# Patient Record
Sex: Female | Born: 1990 | Race: Black or African American | Hispanic: No | Marital: Single | Smoking: Never smoker
Health system: Southern US, Community
[De-identification: ages and names within clinical notes are randomized; demographics above are authoritative.]

## PROBLEM LIST (undated history)

## (undated) ENCOUNTER — Inpatient Hospital Stay (HOSPITAL_COMMUNITY): Payer: Self-pay

## (undated) DIAGNOSIS — J45909 Unspecified asthma, uncomplicated: Secondary | ICD-10-CM

---

## 2013-08-01 ENCOUNTER — Encounter (HOSPITAL_COMMUNITY): Payer: Self-pay | Admitting: Emergency Medicine

## 2013-08-01 ENCOUNTER — Emergency Department (HOSPITAL_COMMUNITY)
Admission: EM | Admit: 2013-08-01 | Discharge: 2013-08-01 | Discharge status: Against Medical Advice | Payer: TRICARE For Life (TFL) | Attending: Emergency Medicine | Admitting: Emergency Medicine

## 2013-08-01 DIAGNOSIS — J3489 Other specified disorders of nose and nasal sinuses: Secondary | ICD-10-CM | POA: Insufficient documentation

## 2013-08-01 DIAGNOSIS — M549 Dorsalgia, unspecified: Secondary | ICD-10-CM | POA: Insufficient documentation

## 2013-08-01 DIAGNOSIS — J45901 Unspecified asthma with (acute) exacerbation: Secondary | ICD-10-CM | POA: Insufficient documentation

## 2013-08-01 HISTORY — DX: Unspecified asthma, uncomplicated: J45.909

## 2013-08-01 MED ORDER — ALBUTEROL SULFATE (5 MG/ML) 0.5% IN NEBU
5.0000 mg | INHALATION_SOLUTION | Freq: Once | RESPIRATORY_TRACT | Status: AC
  Administered 2013-08-01: 5 mg via RESPIRATORY_TRACT
  Filled 2013-08-01: qty 1

## 2013-08-01 NOTE — ED Notes (Signed)
Pt. reports persistent productive cough with nasal congestion for several days unrelieved by MDI , denies fever or chills, pt. stated history of asthma.

## 2013-08-01 NOTE — ED Provider Notes (Signed)
CSN: [id number]     Arrival date & time 08/01/13  2053 History  This chart was scribed for non-physician practitioner Felicie Morn, NP working with Doug Sou, MD by Caryn Bee, ED Scribe and Greggory Stallion, ED scribe. This patient was seen in room TR05C/TR05C and the patient's care was started at 9:17 PM.    Chief Complaint  Patient presents with  . Cough  . Nasal Congestion    The history is provided by the patient. No language interpreter was used.   HPI Comments: Kim Banks is a 22 y.o. female who presents to the Emergency Department complaining of having a few asthma attacks today. She says her wheezing was worse this morning. Pt states she has had a cold, nasal congestion, and fever. She has tried her albuterol inhaler with no relief. She also has associated back pain. Pt denies nausea, fever, emesis, and sore throat.   Past Medical History  Diagnosis Date  . Asthma    History reviewed. No pertinent past surgical history. No family history on file. History  Substance Use Topics  . Smoking status: Never Smoker   . Smokeless tobacco: Not on file  . Alcohol Use: Yes   OB History   Grav Para Term Preterm Abortions TAB SAB Ect Mult Living                 Review of Systems  HENT: Positive for congestion.   Gastrointestinal: Negative for nausea and vomiting.  Musculoskeletal: Positive for back pain.  All other systems reviewed and are negative.    Allergies  Review of patient's allergies indicates no known allergies.  Home Medications  No current outpatient prescriptions on file. BP 130/72  Pulse 97  Temp(Src) 98.3 F (36.8 C) (Oral)  Resp 14  SpO2 98%  LMP 07/20/2013 Physical Exam  Nursing note and vitals reviewed. Constitutional: She is oriented to person, place, and time. She appears well-developed and well-nourished. No distress.  HENT:  Head: Normocephalic and atraumatic.  Eyes: EOM are normal.  Neck: Neck supple. No tracheal deviation present.   Cardiovascular: Normal rate, regular rhythm and normal heart sounds.   Pulmonary/Chest: Effort normal. No respiratory distress. She has wheezes.  Musculoskeletal: Normal range of motion.  Neurological: She is alert and oriented to person, place, and time.  Skin: Skin is warm and dry.  Psychiatric: She has a normal mood and affect. Her behavior is normal.    ED Course  Procedures (including critical care time) DIAGNOSTIC STUDIES: Oxygen Saturation is 98% on room air, normal by my interpretation.    COORDINATION OF CARE: 9:41 PM-Discussed treatment plan which includes nebulizer and prednisone with pt at bedside and pt agreed to plan.     Labs Review Labs Reviewed - No data to display Imaging Review No results found.  Patient apparently eloped after receiving her breathing treatment.    MDM  Asthma exacerbation.   I personally performed the services described in this documentation, which was scribed in my presence. The recorded information has been reviewed and is accurate.    Jimmye Norman, NP 08/01/13 [phone]

## 2013-08-01 NOTE — ED Notes (Signed)
Pt not in room.  Believe she has eloped.

## 2013-08-01 NOTE — ED Notes (Signed)
Pt not in room.

## 2013-08-02 NOTE — ED Provider Notes (Signed)
Medical screening examination/treatment/procedure(s) were performed by non-physician practitioner and as supervising physician I was immediately available for consultation/collaboration.  Jeanann Balinski, MD 08/02/13 0026 

## 2013-10-07 ENCOUNTER — Encounter (HOSPITAL_COMMUNITY): Payer: Self-pay | Admitting: Emergency Medicine

## 2013-10-07 ENCOUNTER — Emergency Department (HOSPITAL_COMMUNITY)
Admission: EM | Admit: 2013-10-07 | Discharge: 2013-10-07 | Disposition: A | Discharge status: Home / Self Care | Payer: TRICARE For Life (TFL) | Attending: Emergency Medicine | Admitting: Emergency Medicine

## 2013-10-07 DIAGNOSIS — M545 Low back pain, unspecified: Secondary | ICD-10-CM | POA: Insufficient documentation

## 2013-10-07 DIAGNOSIS — J45909 Unspecified asthma, uncomplicated: Secondary | ICD-10-CM | POA: Insufficient documentation

## 2013-10-07 DIAGNOSIS — Z79899 Other long term (current) drug therapy: Secondary | ICD-10-CM | POA: Insufficient documentation

## 2013-10-07 MED ORDER — DIAZEPAM 5 MG PO TABS: 5.0000 mg | ORAL_TABLET | Freq: Two times a day (BID) | ORAL | Status: DC

## 2013-10-07 MED ORDER — IBUPROFEN 800 MG PO TABS: 800.0000 mg | ORAL_TABLET | Freq: Three times a day (TID) | ORAL | Status: DC
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
## 2013-10-07 NOTE — ED Provider Notes (Signed)
Medical screening examination/treatment/procedure(s) were performed by non-physician practitioner and as supervising physician I was immediately available for consultation/collaboration.  EKG Interpretation   None        Doug Sou, MD 10/07/13 1614
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
## 2013-11-26 ENCOUNTER — Inpatient Hospital Stay (HOSPITAL_COMMUNITY)
Admission: AD | Admit: 2013-11-26 | Discharge: 2013-11-26 | Disposition: A | Discharge status: Home / Self Care | Source: Ambulatory Visit | Attending: Obstetrics & Gynecology | Admitting: Obstetrics & Gynecology

## 2013-11-26 ENCOUNTER — Encounter (HOSPITAL_COMMUNITY): Payer: Self-pay

## 2013-11-26 ENCOUNTER — Inpatient Hospital Stay (HOSPITAL_COMMUNITY)

## 2013-11-26 DIAGNOSIS — O99891 Other specified diseases and conditions complicating pregnancy: Secondary | ICD-10-CM | POA: Insufficient documentation

## 2013-11-26 DIAGNOSIS — M545 Low back pain, unspecified: Secondary | ICD-10-CM | POA: Insufficient documentation

## 2013-11-26 DIAGNOSIS — R109 Unspecified abdominal pain: Secondary | ICD-10-CM

## 2013-11-26 DIAGNOSIS — O26899 Other specified pregnancy related conditions, unspecified trimester: Secondary | ICD-10-CM

## 2013-11-26 LAB — HCG, QUANTITATIVE, PREGNANCY: hCG, Beta Chain, Quant, S: 13522 m[IU]/mL — ABNORMAL HIGH (ref <5)

## 2013-11-26 LAB — CBC
HCT: 34.2 % — ABNORMAL LOW (ref 36.0–46.0)
Hemoglobin: 11.7 g/dL — ABNORMAL LOW (ref 12.0–15.0)
MCH: 29.4 pg (ref 26.0–34.0)
MCHC: 34.2 g/dL (ref 30.0–36.0)
MCV: 85.9 fL (ref 78.0–100.0)
Platelets: 190 10*3/uL (ref 150–400)
RBC: 3.98 MIL/uL (ref 3.87–5.11)
RDW: 14.8 % (ref 11.5–15.5)
WBC: 7.8 10*3/uL (ref 4.0–10.5)

## 2013-11-26 LAB — URINALYSIS, ROUTINE W REFLEX MICROSCOPIC
Bilirubin Urine: NEGATIVE
Ketones, ur: NEGATIVE mg/dL
Nitrite: NEGATIVE
Specific Gravity, Urine: 1.025 (ref 1.005–1.030)
Urobilinogen, UA: 0.2 mg/dL (ref 0.0–1.0)

## 2013-11-26 LAB — ABO/RH: ABO/RH(D): A NEG

## 2013-11-26 LAB — WET PREP, GENITAL: Yeast Wet Prep HPF POC: NONE SEEN

## 2013-11-26 NOTE — MAU Note (Signed)
Patient states she has had a positive home pregnancy test. Has been having abdominal and back cramping. Wants to be STI tested. Denies bleeding, nausea or vomiting.

## 2013-11-26 NOTE — MAU Provider Note (Signed)
History     CSN: [id number]  Arrival date and time: 11/26/13 1245   First Provider Initiated Contact with Patient 11/26/13 1407      Chief Complaint  Patient presents with  . Possible Pregnancy  . Abdominal Cramping  . Back Pain   HPIpt is G2P1001 @[redacted]w[redacted]d  pregnant with 10/20/13.  Pt had Implanon removed in Sept and has not been using  Contraception since that time.  Pt is concerned about exposure to possible STD on Dec 9.  Pt has been drinking heavily Since Dec 1.  Pt denies vaginal bleeding or vaginal discharge.  Pt had cramping yesterday.  Pt had a normal bowel movement yesterday. Pt has had low back pain.  Pt denies spotting or bleeding.  Pt states she is Rh neg and had Rhogam with her 1st pregnancy. Pt is unsure about keeping this pregnancy.  RN note: Patient states she has had a positive home pregnancy test. Has been having abdominal and back cramping. Wants to be STI tested. Denies bleeding, nausea or vomiting.       Past Medical History  Diagnosis Date  . Asthma     History reviewed. No pertinent past surgical history.  History reviewed. No pertinent family history.  History  Substance Use Topics  . Smoking status: Never Smoker   . Smokeless tobacco: Not on file  . Alcohol Use: Yes     Comment: occ    Allergies: No Known Allergies  Prescriptions prior to admission  Medication Sig Dispense Refill  . acetaminophen (TYLENOL) 325 MG tablet Take 650 mg by mouth every 3 (three) hours as needed for mild pain.      Marland Kitchen albuterol (PROVENTIL HFA;VENTOLIN HFA) 108 (90 BASE) MCG/ACT inhaler Inhale 2 puffs into the lungs every 6 (six) hours as needed for wheezing.      Marland Kitchen albuterol (PROVENTIL) (2.5 MG/3ML) 0.083% nebulizer solution Take 2.5 mg by nebulization every 6 (six) hours as needed for wheezing or shortness of breath.      . diazepam (VALIUM) 5 MG tablet Take 1 tablet (5 mg total) by mouth 2 (two) times daily.  10 tablet  0  . ibuprofen (ADVIL,MOTRIN) 800 MG tablet  Take 1 tablet (800 mg total) by mouth 3 (three) times daily.  21 tablet  0  . Multiple Vitamin (MULTIVITAMIN WITH MINERALS) TABS tablet Take 2 tablets by mouth daily.        Review of Systems  Constitutional: Negative for fever and chills.  Gastrointestinal: Positive for abdominal pain. Negative for nausea, vomiting, diarrhea and constipation.  Genitourinary: Negative for dysuria and urgency.  Musculoskeletal: Positive for back pain.   Physical Exam   Blood pressure 116/74, pulse 78, temperature 98.3 F (36.8 C), temperature source Oral, resp. rate 16, height 5\' 7"  (1.702 m), weight 68.13 kg (150 lb 3.2 oz), last menstrual period 10/20/2013, SpO2 100.00%.  Physical Exam  Nursing note and vitals reviewed. Constitutional: She is oriented to person, place, and time. She appears well-developed and well-nourished. No distress.  HENT:  Head: Normocephalic.  Eyes: Pupils are equal, round, and reactive to light.  Neck: Normal range of motion. Neck supple.  Cardiovascular: Normal rate.   Respiratory: Effort normal.  GI: Soft. She exhibits no distension. There is no tenderness. There is no rebound.  Genitourinary:  Small-mod amount of creamy white discharge in vault; cervic NT, clean- deep in vault; uterus NSSC NT; adnexa without palpable enlargement or tenderness  Musculoskeletal: Normal range of motion.  Neurological: She is alert and  oriented to person, place, and time.  Skin: Skin is warm and dry.  Psychiatric: She has a normal mood and affect.    MAU Course  Procedures Results for orders placed during the hospital encounter of 11/26/13 (from the past 24 hour(s))  URINALYSIS, ROUTINE W REFLEX MICROSCOPIC     Status: None   Collection Time    11/26/13  1:15 PM      Result Value Range   Color, Urine YELLOW  YELLOW   APPearance CLEAR  CLEAR   Specific Gravity, Urine 1.025  1.005 - 1.030   pH 6.5  5.0 - 8.0   Glucose, UA NEGATIVE  NEGATIVE mg/dL   Hgb urine dipstick NEGATIVE   NEGATIVE   Bilirubin Urine NEGATIVE  NEGATIVE   Ketones, ur NEGATIVE  NEGATIVE mg/dL   Protein, ur NEGATIVE  NEGATIVE mg/dL   Urobilinogen, UA 0.2  0.0 - 1.0 mg/dL   Nitrite NEGATIVE  NEGATIVE   Leukocytes, UA NEGATIVE  NEGATIVE  POCT PREGNANCY, URINE     Status: Abnormal   Collection Time    11/26/13  1:45 PM      Result Value Range   Preg Test, Ur POSITIVE (*) NEGATIVE  CBC     Status: Abnormal   Collection Time    11/26/13  2:25 PM      Result Value Range   WBC 7.8  4.0 - 10.5 K/uL   RBC 3.98  3.87 - 5.11 MIL/uL   Hemoglobin 11.7 (*) 12.0 - 15.0 g/dL   HCT 16.1 (*) 09.6 - 04.5 %   MCV 85.9  78.0 - 100.0 fL   MCH 29.4  26.0 - 34.0 pg   MCHC 34.2  30.0 - 36.0 g/dL   RDW 40.9  81.1 - 91.4 %   Platelets 190  150 - 400 K/uL   Results for orders placed during the hospital encounter of 11/26/13 (from the past 24 hour(s))  URINALYSIS, ROUTINE W REFLEX MICROSCOPIC     Status: None   Collection Time    11/26/13  1:15 PM      Result Value Range   Color, Urine YELLOW  YELLOW   APPearance CLEAR  CLEAR   Specific Gravity, Urine 1.025  1.005 - 1.030   pH 6.5  5.0 - 8.0   Glucose, UA NEGATIVE  NEGATIVE mg/dL   Hgb urine dipstick NEGATIVE  NEGATIVE   Bilirubin Urine NEGATIVE  NEGATIVE   Ketones, ur NEGATIVE  NEGATIVE mg/dL   Protein, ur NEGATIVE  NEGATIVE mg/dL   Urobilinogen, UA 0.2  0.0 - 1.0 mg/dL   Nitrite NEGATIVE  NEGATIVE   Leukocytes, UA NEGATIVE  NEGATIVE  POCT PREGNANCY, URINE     Status: Abnormal   Collection Time    11/26/13  1:45 PM      Result Value Range   Preg Test, Ur POSITIVE (*) NEGATIVE  WET PREP, GENITAL     Status: Abnormal   Collection Time    11/26/13  2:22 PM      Result Value Range   Yeast Wet Prep HPF POC NONE SEEN  NONE SEEN   Trich, Wet Prep NONE SEEN  NONE SEEN   Clue Cells Wet Prep HPF POC NONE SEEN  NONE SEEN   WBC, Wet Prep HPF POC MANY (*) NONE SEEN  CBC     Status: Abnormal   Collection Time    11/26/13  2:25 PM      Result Value Range    WBC 7.8  4.0 - 10.5 K/uL   RBC 3.98  3.87 - 5.11 MIL/uL   Hemoglobin 11.7 (*) 12.0 - 15.0 g/dL   HCT 16.1 (*) 09.6 - 04.5 %   MCV 85.9  78.0 - 100.0 fL   MCH 29.4  26.0 - 34.0 pg   MCHC 34.2  30.0 - 36.0 g/dL   RDW 40.9  81.1 - 91.4 %   Platelets 190  150 - 400 K/uL  HCG, QUANTITATIVE, PREGNANCY     Status: Abnormal   Collection Time    11/26/13  2:25 PM      Result Value Range   hCG, Beta Chain, Quant, S 13522 (*) <5 mIU/mL  ABO/RH     Status: None   Collection Time    11/26/13  2:25 PM      Result Value Range   ABO/RH(D) A NEG    US Ob Comp Less 14 Wks  11/26/2013   CLINICAL DATA:  Cramping  EXAM: OBSTETRIC <14 WK [place] AND TRANSVAGINAL OB US  TECHNIQUE: Both transabdominal and transvaginal ultrasound examinations were performed for complete evaluation of the gestation as well as the maternal uterus, adnexal regions, and pelvic cul-de-sac. Transvaginal technique was performed to assess early pregnancy.  COMPARISON:  None.  FINDINGS: Intrauterine gestational sac: Visualized/normal in shape.  Yolk sac:  Present.  Embryo:  Not visualized  Cardiac Activity: Not appreciated  Heart Rate:  Not applicable  CRL:   14.2  mm   6 w 2 d                  [place] EDC: 07/20/2014  Maternal uterus/adnexae: Unremarkable  IMPRESSION: 1. An embryo is not sonographically appreciated. Differential considerations are and early viable intrauterine pregnancy versus a nonviable pregnancy. Etiologies such as a blighted ovum or very early spontaneous abortion. Correlation with serial beta HCG is recommended. Surveillance ultrasound recommended as clinically warranted.   Electronically Signed   By: Salome Holmes M.D.   On: 11/26/2013 15:04   US Ob Transvaginal  11/26/2013   CLINICAL DATA:  Cramping  EXAM: OBSTETRIC <14 WK [place] AND TRANSVAGINAL OB US  TECHNIQUE: Both transabdominal and transvaginal ultrasound examinations were performed for complete evaluation of the gestation as well as the maternal uterus, adnexal regions,  and pelvic cul-de-sac. Transvaginal technique was performed to assess early pregnancy.  COMPARISON:  None.  FINDINGS: Intrauterine gestational sac: Visualized/normal in shape.  Yolk sac:  Present.  Embryo:  Not visualized  Cardiac Activity: Not appreciated  Heart Rate:  Not applicable  CRL:   14.2  mm   6 w 2 d                  [place] EDC: 07/20/2014  Maternal uterus/adnexae: Unremarkable  IMPRESSION: 1. An embryo is not sonographically appreciated. Differential considerations are and early viable intrauterine pregnancy versus a nonviable pregnancy. Etiologies such as a blighted ovum or very early spontaneous abortion. Correlation with serial beta HCG is recommended. Surveillance ultrasound recommended as clinically warranted.   Electronically Signed   By: Salome Holmes M.D.   On: 11/26/2013 15:04   GC/chlamdyia pending Assessment and Plan  abd pain in pregnancy [redacted]w[redacted]d pregnant w/YS no embryo F/u with prenatal care  LINEBERRY,SUSAN 11/26/2013, 2:08 PM

## 2013-11-27 LAB — GC/CHLAMYDIA PROBE AMP
CT Probe RNA: NEGATIVE
GC Probe RNA: NEGATIVE

## 2013-11-30 NOTE — MAU Provider Note (Signed)
Pt should seek prenatal care and have f/u US for viability as outpatient with provider of choice. Attestation of Attending Supervision of Advanced Practitioner (CNM/NP): Evaluation and management procedures were performed by the Advanced Practitioner under my supervision and collaboration. I have reviewed the Advanced Practitioner's note and chart, and I agree with the management and plan.  LEGGETT,KELLY H. 12:27 PM

## 2013-12-08 ENCOUNTER — Emergency Department (HOSPITAL_COMMUNITY)

## 2013-12-08 ENCOUNTER — Emergency Department (HOSPITAL_COMMUNITY)
Admission: EM | Admit: 2013-12-08 | Discharge: 2013-12-09 | Disposition: A | Discharge status: Home / Self Care | Attending: Emergency Medicine | Admitting: Emergency Medicine

## 2013-12-08 ENCOUNTER — Encounter (HOSPITAL_COMMUNITY): Payer: Self-pay | Admitting: Emergency Medicine

## 2013-12-08 DIAGNOSIS — Z79899 Other long term (current) drug therapy: Secondary | ICD-10-CM | POA: Insufficient documentation

## 2013-12-08 DIAGNOSIS — IMO0001 Reserved for inherently not codable concepts without codable children: Secondary | ICD-10-CM | POA: Insufficient documentation

## 2013-12-08 DIAGNOSIS — J189 Pneumonia, unspecified organism: Secondary | ICD-10-CM

## 2013-12-08 DIAGNOSIS — J45901 Unspecified asthma with (acute) exacerbation: Secondary | ICD-10-CM | POA: Insufficient documentation

## 2013-12-08 DIAGNOSIS — Z8742 Personal history of other diseases of the female genital tract: Secondary | ICD-10-CM | POA: Insufficient documentation

## 2013-12-08 DIAGNOSIS — R000 Tachycardia, unspecified: Secondary | ICD-10-CM | POA: Insufficient documentation

## 2013-12-08 DIAGNOSIS — Z8759 Personal history of other complications of pregnancy, childbirth and the puerperium: Secondary | ICD-10-CM

## 2013-12-08 DIAGNOSIS — J159 Unspecified bacterial pneumonia: Secondary | ICD-10-CM | POA: Insufficient documentation

## 2013-12-08 LAB — POCT PREGNANCY, URINE: Preg Test, Ur: POSITIVE — AB

## 2013-12-08 MED ORDER — IPRATROPIUM-ALBUTEROL 0.5-2.5 (3) MG/3ML IN SOLN
3.0000 mL | RESPIRATORY_TRACT | Status: DC
Start: 2013-12-09 — End: 2013-12-09
  Administered 2013-12-08: 3 mL via RESPIRATORY_TRACT
  Filled 2013-12-08: qty 3

## 2013-12-08 MED ORDER — RHO D IMMUNE GLOBULIN 1500 UNIT/2ML IJ SOLN
300.0000 ug | Freq: Once | INTRAMUSCULAR | Status: AC
  Administered 2013-12-09: 300 ug via INTRAMUSCULAR

## 2013-12-08 MED ORDER — AZITHROMYCIN 250 MG PO TABS: 250.0000 mg | ORAL_TABLET | Freq: Every day | ORAL | Status: DC

## 2013-12-08 MED ORDER — RHO D IMMUNE GLOBULIN 1500 UNIT/2ML IJ SOLN: 300.0000 ug | Freq: Once | INTRAMUSCULAR | Status: DC

## 2013-12-08 MED ORDER — ALBUTEROL SULFATE (2.5 MG/3ML) 0.083% IN NEBU: 2.5000 mg | INHALATION_SOLUTION | Freq: Four times a day (QID) | RESPIRATORY_TRACT | Status: DC | PRN

## 2013-12-08 NOTE — ED Notes (Signed)
Presents with body aches, cough, chills/hotflashes since Sunday, reports pain in chest when coughing. Sats 100% RA, c/o runny nose. Coughing up yellow mucus.  Reports Medical abortion last Tuesday, took the pill, was [redacted] weeks pregnant at the time.

## 2013-12-08 NOTE — ED Provider Notes (Signed)
CSN: [id number]     Arrival date & time 12/08/13  1831 History  This chart was scribed for non-physician practitioner Mellody Drown, PA-C, working with Raeford Razor, MD by Nicholos Johns, ED scribe. This patient was seen in room TR10C/TR10C and the patient's care was started at 9:47 PM.   Chief Complaint  Patient presents with  . Influenza   The history is provided by the patient. No language interpreter was used.  HPI Comments: Kim Banks is a 23 y.o. female who presents to the Emergency Department complaining of body aches, SOB, cough, rhinorrhea, congestion, and intermittent wheezing, onset 1 day ago. Pt states she felt hot but never checked her temperature before coming to the ER. Pt regularly using albuterol inhaler, reports this would only provide temporarily relief for present sx.  She reports she is out of her albuterol nebulizer solution. Pt states she last used her albuterol prior to ER visit today. Pt states she had a medical abortion 1 week ago; states this was not her first pregnancy. Pt has a follow up appointment for the medical abortion on December 16, 2013.  Requesting Rhogam shot. Denies any prior hospitalizations. Denies abdominal pain.  Past Medical History  Diagnosis Date  . Asthma    No past surgical history on file. No family history on file. History  Substance Use Topics  . Smoking status: Never Smoker   . Smokeless tobacco: Not on file  . Alcohol Use: Yes     Comment: occ   OB History   Grav Para Term Preterm Abortions TAB SAB Ect Mult Living   2 1 1       1      Review of Systems  Constitutional: Negative for fever and chills.  HENT: Positive for congestion and rhinorrhea.   Respiratory: Positive for cough, shortness of breath and wheezing.   Gastrointestinal: Negative for abdominal pain.  Musculoskeletal: Positive for myalgias.  Skin: Negative for rash.  All other systems reviewed and are negative.    Allergies  Review of patient's allergies  indicates no known allergies.  Home Medications   Current Outpatient Rx  Name  Route  Sig  Dispense  Refill  . albuterol (PROVENTIL HFA;VENTOLIN HFA) 108 (90 BASE) MCG/ACT inhaler   Inhalation   Inhale 2 puffs into the lungs every 6 (six) hours as needed for wheezing.         Marland Kitchen albuterol (PROVENTIL) (2.5 MG/3ML) 0.083% nebulizer solution   Nebulization   Take 2.5 mg by nebulization every 6 (six) hours as needed for wheezing or shortness of breath.         Marland Kitchen albuterol (PROVENTIL) (2.5 MG/3ML) 0.083% nebulizer solution   Nebulization   Take 3 mLs (2.5 mg total) by nebulization every 6 (six) hours as needed for wheezing or shortness of breath.   75 mL   12   . azithromycin (ZITHROMAX Z-PAK) 250 MG tablet   Oral   Take 1 tablet (250 mg total) by mouth daily. 500mg  PO day 1, then 250mg  PO days 2-5   6 tablet   0    Triage Vitals: BP 112/98  Pulse 112  Temp(Src) 98.9 F (37.2 C) (Oral)  Resp 16  Wt 146 lb 7 oz (66.424 kg)  SpO2 100%  LMP 10/20/2013  Breastfeeding? Unknown Physical Exam  Nursing note and vitals reviewed. Constitutional: She is oriented to person, place, and time. She appears well-developed and well-nourished. No distress.  HENT:  Head: Normocephalic and atraumatic.  Eyes: Conjunctivae and  EOM are normal.  Neck: Neck supple. No tracheal deviation present.  Cardiovascular: Regular rhythm and normal heart sounds.  Tachycardia present.   No murmur heard. Pulmonary/Chest: Effort normal. No accessory muscle usage. Not tachypneic. No respiratory distress. She has wheezes. She has rhonchi.  Moderate expiratory wheezing right lung fields greater than left  Abdominal: Soft.  Musculoskeletal: Normal range of motion.  Neurological: She is alert and oriented to person, place, and time.  Skin: Skin is warm and dry.  Psychiatric: She has a normal mood and affect. Her behavior is normal.    ED Course  Procedures (including critical care time) DIAGNOSTIC  STUDIES: Oxygen Saturation is 100% on room air, normal by my interpretation.    COORDINATION OF CARE: At 9:47 PM: Discussed treatment plan with patient which includes a Z-Pak. Patient agrees.   Labs Review Labs Reviewed  POCT PREGNANCY, URINE - Abnormal; Notable for the following:    Preg Test, Ur POSITIVE (*)    All other components within normal limits  RH IG WORKUP (INCLUDES ABO/RH)   Imaging Review Dg Chest 2 View  12/08/2013   CLINICAL DATA:  Cough.  Congestion.  Weakness.  EXAM: CHEST  2 VIEW  COMPARISON:  None.  FINDINGS: Faint area patchy airspace disease is present in the inferior left upper lobe seen on the frontal view. This suggests a small focus of left upper lobe bronchopneumonia. No plain film evidence of adenopathy. Cardiopericardial silhouette appears within normal limits. Mild right basilar atelectasis. No other areas of airspace disease.  IMPRESSION: Small focus of left upper lobe airspace disease suspicious for bronchopneumonia.   Electronically Signed   By: Andreas NewportGeoffrey  Lamke M.D.   On: 12/08/2013 20:54    EKG Interpretation   None       MDM   1. Community acquired pneumonia   2. Abortion history   Pt with a history of productive cough, and wheezing on exam.  Will give breathing treatment and re-evaluate.  XR shows pneumonia, will treat as CAP.  Requesting rhogam for recent abortion, ordered.  EMR shows A negative, however the blood bank is not releasing the rho (d) injection due to [facility]  Lab panel ordered per Blood bank.  Re-eval Patient reports symptom improvement. Minimal expiratory wheezing at left lung base. Discussed lab results, imaging results, and treatment plan with the patient. Return precautions given. Reports understanding and no other concerns at this time.  Patient is stable for discharge at this time.  Meds given in ED:  Medications  ipratropium-albuterol (DUONEB) 0.5-2.5 (3) MG/3ML nebulizer solution 3 mL (3 mLs  Nebulization Given 12/08/13 2212)  rho(d) immune globulin (RHIG/Rhophylac) injection 300 mcg (not administered)    New Prescriptions   ALBUTEROL (PROVENTIL) (2.5 MG/3ML) 0.083% NEBULIZER SOLUTION    Take 3 mLs (2.5 mg total) by nebulization every 6 (six) hours as needed for wheezing or shortness of breath.   AZITHROMYCIN (ZITHROMAX Z-PAK) 250 MG TABLET    Take 1 tablet (250 mg total) by mouth daily. 500mg  PO day 1, then 250mg  PO days 2-5    I personally performed the services described in this documentation, which was scribed in my presence. The recorded information has been reviewed and is accurate.       Clabe SealLauren M Shloma Roggenkamp, PA-C 12/10/13 1349

## 2013-12-08 NOTE — ED Notes (Signed)
Pt states she thinks she has the flu, she has generalized body aches, cough, and sob. Pt states she has been taking otc meds and her inhaler but has not been feeling better. Pt states her sx started Sunday and she has been around someone that has been sick.

## 2013-12-09 NOTE — ED Notes (Signed)
Lab contacted about RH test results, states it is still in process

## 2013-12-10 LAB — RH IG WORKUP (INCLUDES ABO/RH)
ABO/RH(D): A NEG
ANTIBODY SCREEN: NEGATIVE
Gestational Age(Wks): 4
UNIT DIVISION: 0

## 2013-12-12 NOTE — ED Provider Notes (Signed)
Medical screening examination/treatment/procedure(s) were performed by non-physician practitioner and as supervising physician I was immediately available for consultation/collaboration.  EKG Interpretation   None        Harmon Bommarito, MD 12/12/13 2157 

## 2014-03-02 ENCOUNTER — Encounter (HOSPITAL_COMMUNITY): Payer: Self-pay | Admitting: Emergency Medicine

## 2014-03-02 ENCOUNTER — Emergency Department (HOSPITAL_COMMUNITY)
Admission: EM | Admit: 2014-03-02 | Discharge: 2014-03-02 | Disposition: A | Discharge status: Home / Self Care | Attending: Emergency Medicine | Admitting: Emergency Medicine

## 2014-03-02 DIAGNOSIS — Z79899 Other long term (current) drug therapy: Secondary | ICD-10-CM | POA: Insufficient documentation

## 2014-03-02 DIAGNOSIS — J45909 Unspecified asthma, uncomplicated: Secondary | ICD-10-CM | POA: Insufficient documentation

## 2014-03-02 DIAGNOSIS — H109 Unspecified conjunctivitis: Secondary | ICD-10-CM | POA: Insufficient documentation

## 2014-03-02 DIAGNOSIS — Z792 Long term (current) use of antibiotics: Secondary | ICD-10-CM | POA: Insufficient documentation

## 2014-03-02 MED ORDER — TETRACAINE HCL 0.5 % OP SOLN
2.0000 [drp] | Freq: Once | OPHTHALMIC | Status: DC
  Filled 2014-03-02: qty 2

## 2014-03-02 MED ORDER — LEVOFLOXACIN 0.5 % OP SOLN: 1.0000 [drp] | OPHTHALMIC | Status: DC

## 2014-03-02 MED ORDER — FLUORESCEIN SODIUM 1 MG OP STRP
1.0000 | ORAL_STRIP | Freq: Once | OPHTHALMIC | Status: DC
Start: 2014-03-02 — End: 2014-03-02
  Filled 2014-03-02: qty 1

## 2014-03-02 NOTE — ED Provider Notes (Signed)
CSN: [id number]     Arrival date & time 03/02/14  1103 History  This chart was scribed for non-physician practitioner, Trixie DredgeEmily Damonie Ellenwood, PA-C working with Lyanne CoKevin M Campos, MD by Greggory StallionKayla Andersen, ED scribe. This patient was seen in room TR04C/TR04C and the patient's care was started at 2:19 PM.   Chief Complaint  Patient presents with  . Conjunctivitis   The history is provided by the patient. No language interpreter was used.   HPI Comments: Kim Banks is a 23 y.o. female who presents to the Emergency Department complaining of left eye swelling, itching, redness and drainage that started 3 days ago. She states she woke up today with her eye matted shut. Pt has associated photophobia. She has use eye drops with no relief. Pt wore colored contacts 4 days ago and scratched her eye trying to take them out. She has seasonal allergies but is unsure if that is what is causing her symptoms. Denies fever, rhinorrhea, congestion, sinus pressure, blurry vision, diplopia, cough. Pt was recently around someone with pink eye.   Past Medical History  Diagnosis Date  . Asthma    No past surgical history on file. No family history on file. History  Substance Use Topics  . Smoking status: Never Smoker   . Smokeless tobacco: Not on file  . Alcohol Use: Yes     Comment: occ   OB History   Grav Para Term Preterm Abortions TAB SAB Ect Mult Living   2 1 1       1      Review of Systems  Constitutional: Negative for fever.  HENT: Positive for facial swelling. Negative for congestion, rhinorrhea and sinus pressure.   Eyes: Positive for photophobia, discharge and redness. Negative for visual disturbance.  Respiratory: Negative for cough.   All other systems reviewed and are negative.   Allergies  Review of patient's allergies indicates no known allergies.  Home Medications   Current Outpatient Rx  Name  Route  Sig  Dispense  Refill  . albuterol (PROVENTIL HFA;VENTOLIN HFA) 108 (90 BASE) MCG/ACT inhaler   Inhalation   Inhale 2 puffs into the lungs every 6 (six) hours as needed for wheezing.         Marland Kitchen. albuterol (PROVENTIL) (2.5 MG/3ML) 0.083% nebulizer solution   Nebulization   Take 2.5 mg by nebulization every 6 (six) hours as needed for wheezing or shortness of breath.         Marland Kitchen. albuterol (PROVENTIL) (2.5 MG/3ML) 0.083% nebulizer solution   Nebulization   Take 3 mLs (2.5 mg total) by nebulization every 6 (six) hours as needed for wheezing or shortness of breath.   75 mL   12   . azithromycin (ZITHROMAX Z-PAK) 250 MG tablet   Oral   Take 1 tablet (250 mg total) by mouth daily. 500mg  PO day 1, then 250mg  PO days 2-5   6 tablet   0    BP 121/76  Pulse 67  Temp(Src) 97.8 F (36.6 C) (Oral)  Resp 18  SpO2 96%  LMP 10/17/2013  Physical Exam  Nursing note and vitals reviewed. Constitutional: She appears well-developed and well-nourished. No distress.  HENT:  Head: Normocephalic and atraumatic.  Eyes: EOM are normal. Pupils are equal, round, and reactive to light. Right eye exhibits no discharge. Left eye exhibits no discharge. Right conjunctiva is not injected. Right conjunctiva has no hemorrhage. Left conjunctiva is injected. Left conjunctiva has no hemorrhage. No scleral icterus.  Slit lamp exam:  The left eye shows no corneal abrasion, no corneal flare, no corneal ulcer, no foreign body, no hyphema, no hypopyon and no fluorescein uptake.  Visual Acuity - R Distance: 20/25 ; L Distance: 20/25  No corneal abrasion noted. Mild edema to left lid. No erythema or tenderness. Normal EOMs.   Neck: Neck supple.  Pulmonary/Chest: Effort normal.  Neurological: She is alert.  Skin: She is not diaphoretic.    ED Course  Procedures (including critical care time)  DIAGNOSTIC STUDIES: Oxygen Saturation is 96% on RA, normal by my interpretation.    COORDINATION OF CARE: 2:16 PM-Discussed treatment plan which includes checking for a corneal abrasion with pt at bedside and pt  agreed to plan.   2:22 PM-Will treat for conjunctivitis and give pt antibiotic eye drops.   Labs Review Labs Reviewed - No data to display Imaging Review No results found.   EKG Interpretation None      MDM   Final diagnoses:  Conjunctivitis    Afebrile, nontoxic patient with symptoms c/w conjunctivitis.  No corneal abrasion seen on exam.  D/C home with levofloxacin eyedrops given contact lens solution.  Pt advised this is contagious and recommended throwing out old contacts (they are disposable). Advised not to wear contacts until completely cleared.  Discussed findings, treatment, and follow up  with patient.  Pt given return precautions.  Pt verbalizes understanding and agrees with plan.      I personally performed the services described in this documentation, which was scribed in my presence. The recorded information has been reviewed and is accurate.  Trixie Dredge, PA-C 03/02/14 1652

## 2014-03-02 NOTE — Discharge Instructions (Signed)
Read the information below.  Use the prescribed medication as directed.  Please discuss all new medications with your pharmacist.  You may return to the Emergency Department at any time for worsening condition or any new symptoms that concern you.  If there is any possibility that you might be pregnant, please let your health care provider know and discuss this with the pharmacist to ensure medication safety.    Conjunctivitis Conjunctivitis is commonly called "pink eye." Conjunctivitis can be caused by bacterial or viral infection, allergies, or injuries. There is usually redness of the lining of the eye, itching, discomfort, and sometimes discharge. There may be deposits of matter along the eyelids. A viral infection usually causes a watery discharge, while a bacterial infection causes a yellowish, thick discharge. Pink eye is very contagious and spreads by direct contact. You may be given antibiotic eyedrops as part of your treatment. Before using your eye medicine, remove all drainage from the eye by washing gently with warm water and cotton balls. Continue to use the medication until you have awakened 2 mornings in a row without discharge from the eye. Do not rub your eye. This increases the irritation and helps spread infection. Use separate towels from other household members. Wash your hands with soap and water before and after touching your eyes. Use cold compresses to reduce pain and sunglasses to relieve irritation from light. Do not wear contact lenses or wear eye makeup until the infection is gone. SEEK MEDICAL CARE IF:   Your symptoms are not better after 3 days of treatment.  You have increased pain or trouble seeing.  The outer eyelids become very red or swollen. Document Released: 12/21/2004 Document Revised: 02/05/2012 Document Reviewed: 11/13/2005 [facility] Patient Information 2014 [place].

## 2014-03-02 NOTE — ED Notes (Signed)
Pt reports to the ED for eval of swelling, itching, erythema, and yellow drainage from her left eye. She reports she was around someone who had pink eye. She reports that upon awakening her eye will be crusted closed. Swelling and reddened conjunctiva noted. Pt reports some photophobia. Denies any visual changes, blurred vision, or diplopia. Also denies any fevers or chills. Pt A&Ox4, resp e/u, and skin warm and dry.

## 2014-03-03 NOTE — ED Provider Notes (Signed)
Medical screening examination/treatment/procedure(s) were performed by non-physician practitioner and as supervising physician I was immediately available for consultation/collaboration.   EKG Interpretation None        Greggory Safranek M Girtie Wiersma, MD 03/03/14 0736 

## 2014-09-28 ENCOUNTER — Encounter (HOSPITAL_COMMUNITY): Payer: Self-pay | Admitting: Emergency Medicine

## 2015-01-04 IMAGING — US US OB COMP LESS 14 WK
1 series · 14 of 28 positions shown · non-contrast
Comparison: None.

CLINICAL DATA: Cramping

EXAM:
OBSTETRIC <14 WK US AND TRANSVAGINAL OB US
TECHNIQUE: Both transabdominal and transvaginal ultrasound examinations were
performed for complete evaluation of the gestation as well as the
maternal uterus, adnexal regions, and pelvic cul-de-sac.
Transvaginal technique was performed to assess early pregnancy.

[Series 1: us ob comp less 14 wks · 14 of 35 slices shown]
[im 2/35]
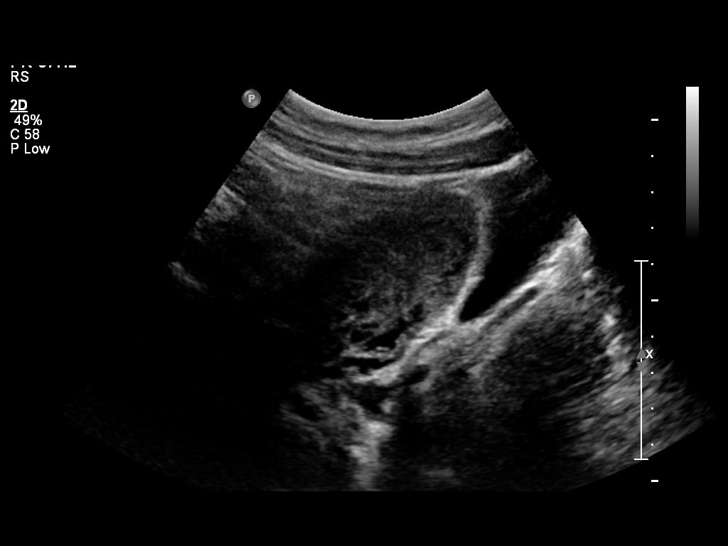
[im 4/35]
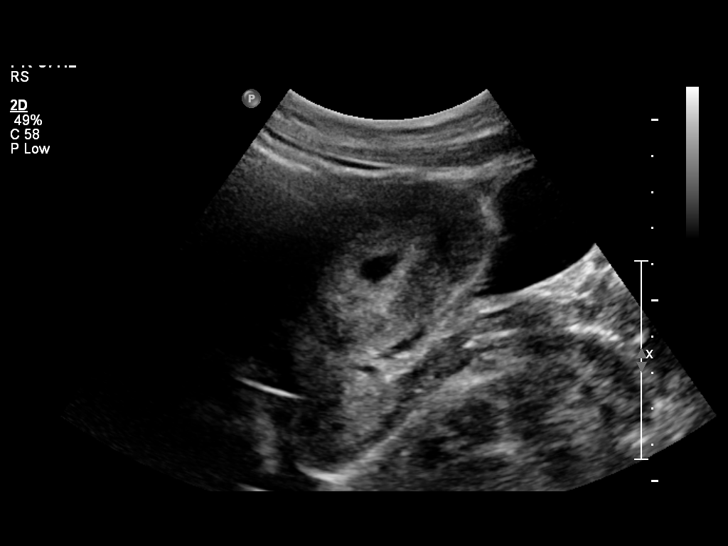
[im 7/35]
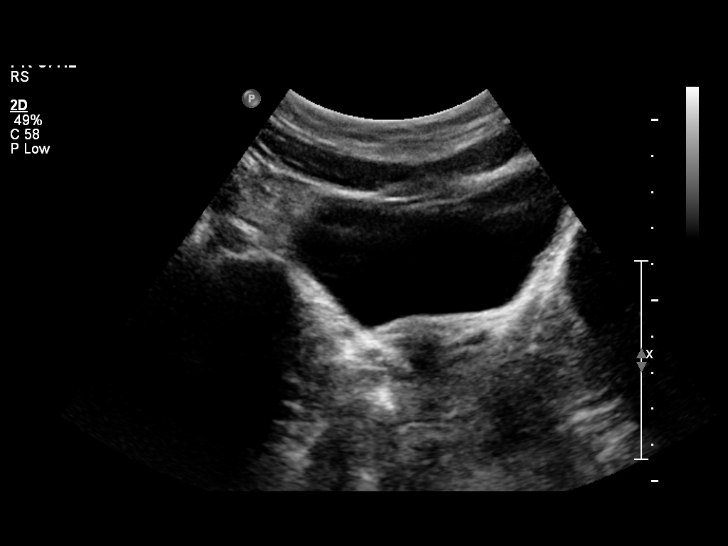
[im 9/35]
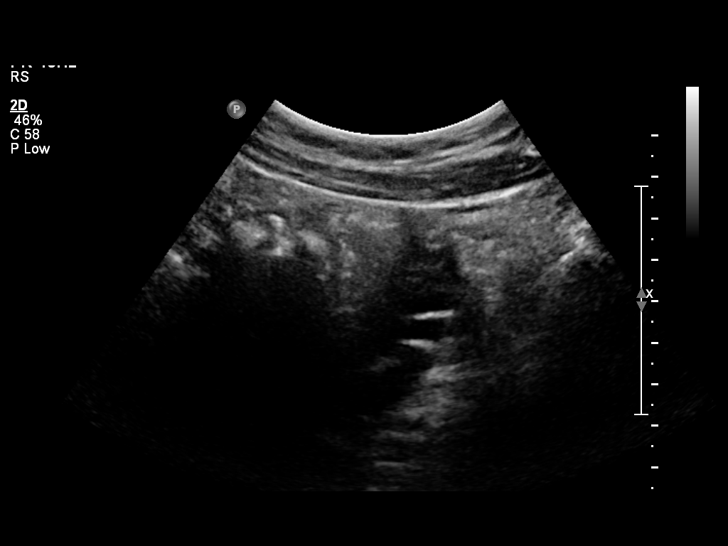
[im 12/35]
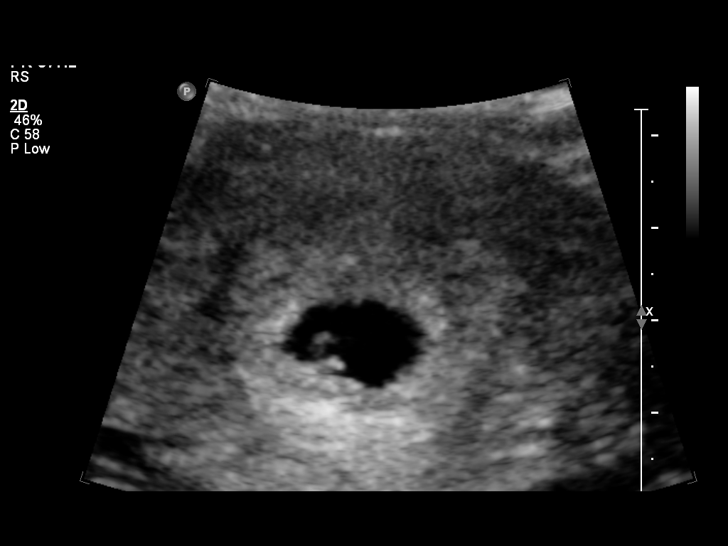
[im 14/35]
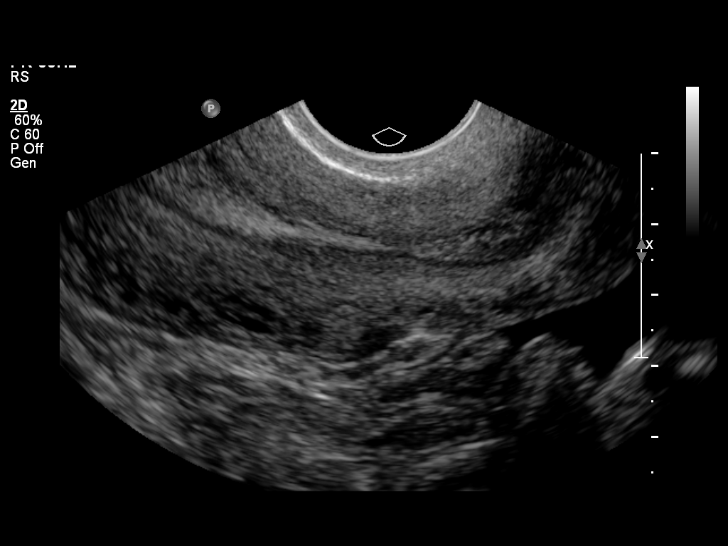
[im 17/35]
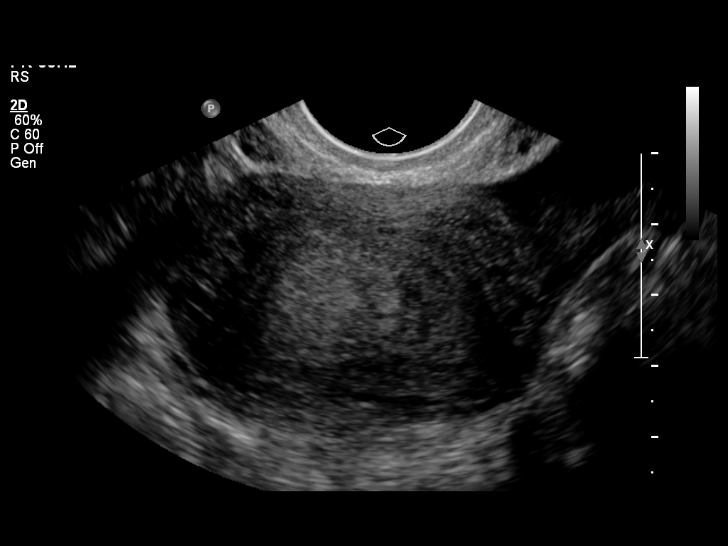
[im 19/35]
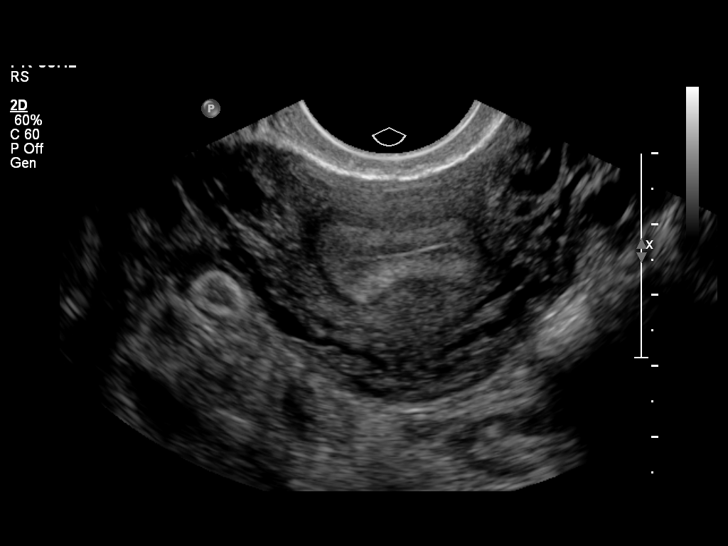
[im 22/35]
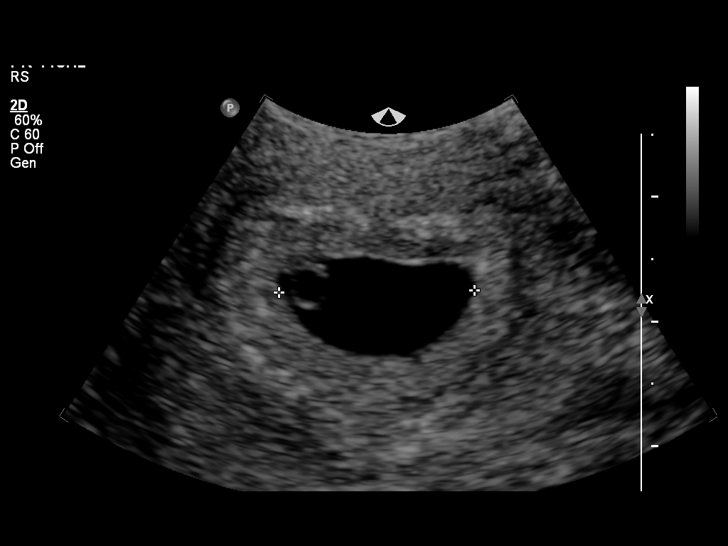
[im 24/35]
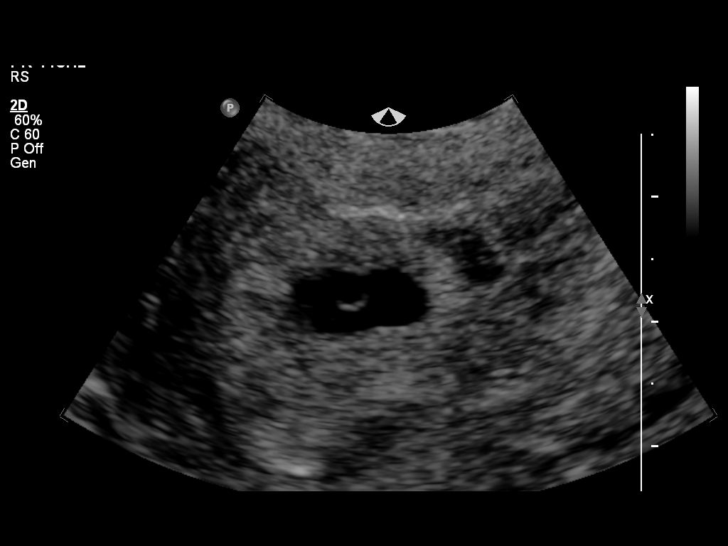
[im 27/35]
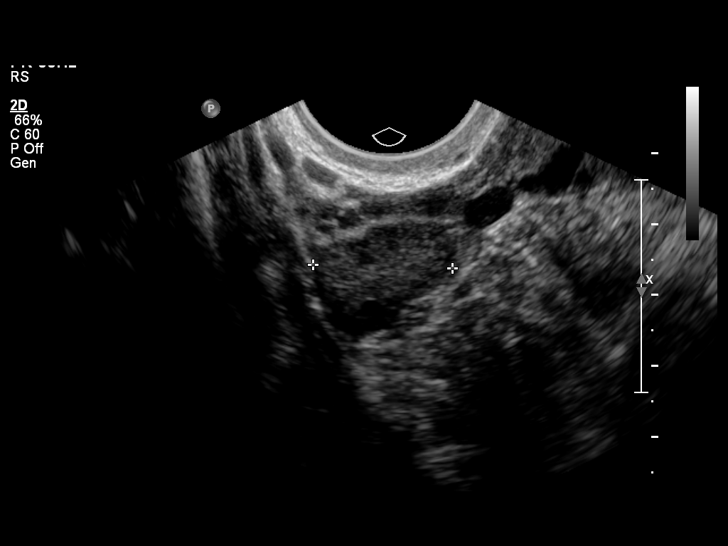
[im 29/35]
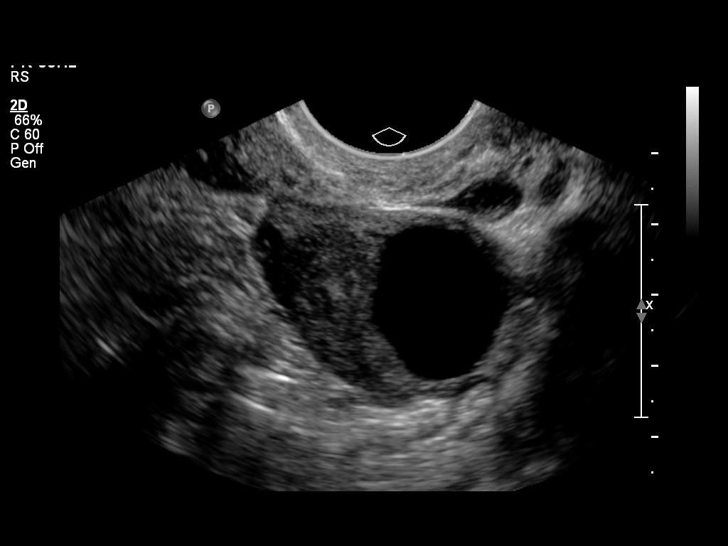
[im 32/35]
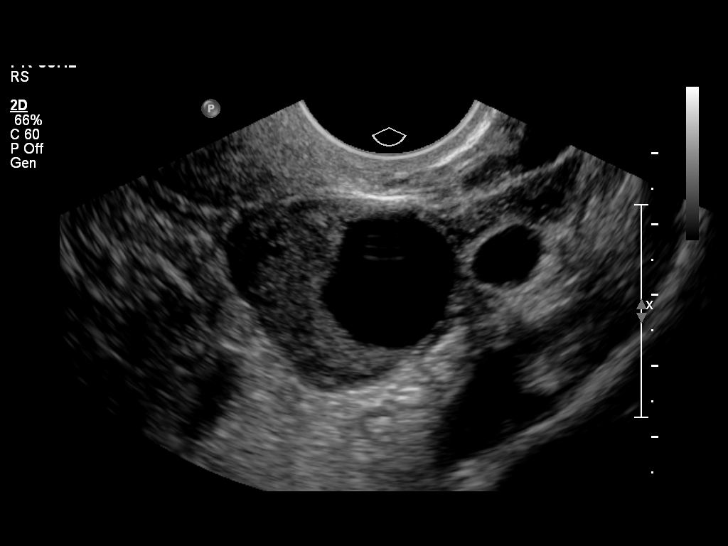
[im 35/35]
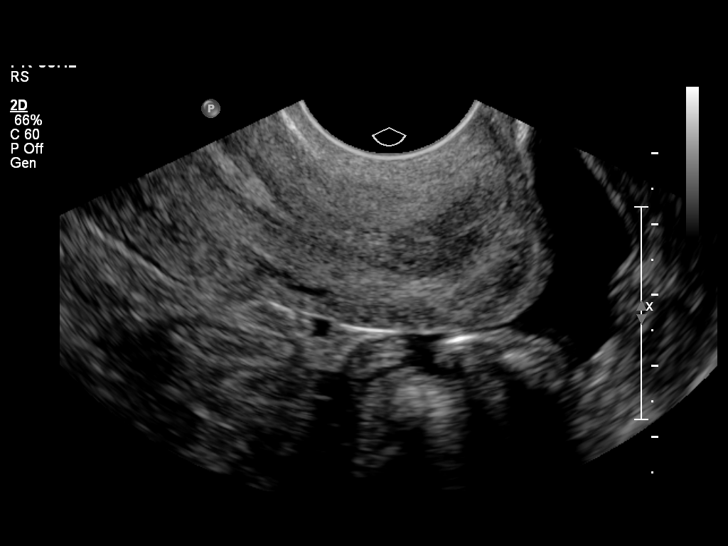

[14 of 28 positions shown; findings below may reference images not displayed]

FINDINGS: Intrauterine gestational sac: Visualized/normal in shape.

Yolk sac:  Present.

Embryo:  Not visualized

Cardiac Activity: Not appreciated

Heart Rate:  Not applicable

CRL:   14.2  mm   6 w 2 d                  US EDC: 07/20/2014

Maternal uterus/adnexae: Unremarkable
IMPRESSION: 1. An embryo is not sonographically appreciated. Differential
considerations are and early viable intrauterine pregnancy versus a
nonviable pregnancy. Etiologies such as a blighted ovum or very
early spontaneous abortion. Correlation with serial beta HCG is
recommended. Surveillance ultrasound recommended as clinically
warranted.

## 2015-02-09 ENCOUNTER — Ambulatory Visit (INDEPENDENT_AMBULATORY_CARE_PROVIDER_SITE_OTHER): Admitting: Family Medicine

## 2015-02-09 ENCOUNTER — Telehealth: Payer: Self-pay | Admitting: *Deleted

## 2015-02-09 VITALS — BP 116/66 | HR 91 | Temp 97.9°F | Resp 16 | Ht 66.25 in | Wt 142.4 lb

## 2015-02-09 DIAGNOSIS — Z7251 High risk heterosexual behavior: Secondary | ICD-10-CM

## 2015-02-09 DIAGNOSIS — R21 Rash and other nonspecific skin eruption: Secondary | ICD-10-CM | POA: Diagnosis not present

## 2015-02-09 DIAGNOSIS — N898 Other specified noninflammatory disorders of vagina: Secondary | ICD-10-CM

## 2015-02-09 LAB — POCT WET PREP WITH KOH
RBC Wet Prep HPF POC: NEGATIVE
Trichomonas, UA: NEGATIVE
Yeast Wet Prep HPF POC: NEGATIVE

## 2015-02-09 NOTE — Telephone Encounter (Signed)
[name] notified by telephone of wet prep results.

## 2015-02-09 NOTE — Patient Instructions (Addendum)
You should receive a call or letter about your lab results within the next week to 10 days. We will call you about the trichomonas and wet prep test tonight.   The bumps in the genital area appear to be "shave bumps".  Avoid shaving area for now,. Then as improves - try not to shave as close, and brush off loose hairs after shaving. If new areas or any worsening - return for recheck.   If you do not have your period at typical time, then you can either check a home urine pregnancy test (within a week to 10 days from missed period), or return here for testing.  Condoms every time and see information below on contraceptive options. Let me know if you want to discuss these further or if you have any questions.  Return to the clinic or go to the nearest emergency room if any of your symptoms worsen or new symptoms occur.   Contraception Choices Contraception (birth control) is the use of any methods or devices to prevent pregnancy. Below are some methods to help avoid pregnancy. HORMONAL METHODS   Contraceptive implant. This is a thin, plastic tube containing progesterone hormone. It does not contain estrogen hormone. Your health care provider inserts the tube in the inner part of the upper arm. The tube can remain in place for up to 3 years. After 3 years, the implant must be removed. The implant prevents the ovaries from releasing an egg (ovulation), thickens the cervical mucus to prevent sperm from entering the uterus, and thins the lining of the inside of the uterus.  Progesterone-only injections. These injections are given every 3 months by your health care provider to prevent pregnancy. This synthetic progesterone hormone stops the ovaries from releasing eggs. It also thickens cervical mucus and changes the uterine lining. This makes it harder for sperm to survive in the uterus.  Birth control pills. These pills contain estrogen and progesterone hormone. They work by preventing the ovaries from  releasing eggs (ovulation). They also cause the cervical mucus to thicken, preventing the sperm from entering the uterus. Birth control pills are prescribed by a health care provider.Birth control pills can also be used to treat heavy periods.  Minipill. This type of birth control pill contains only the progesterone hormone. They are taken every day of each month and must be prescribed by your health care provider.  Birth control patch. The patch contains hormones similar to those in birth control pills. It must be changed once a week and is prescribed by a health care provider.  Vaginal ring. The ring contains hormones similar to those in birth control pills. It is left in the vagina for 3 weeks, removed for 1 week, and then a new one is put back in place. The patient must be comfortable inserting and removing the ring from the vagina.A health care provider's prescription is necessary.  Emergency contraception. Emergency contraceptives prevent pregnancy after unprotected sexual intercourse. This pill can be taken right after sex or up to 5 days after unprotected sex. It is most effective the sooner you take the pills after having sexual intercourse. Most emergency contraceptive pills are available without a prescription. Check with your pharmacist. Do not use emergency contraception as your only form of birth control. BARRIER METHODS   Female condom. This is a thin sheath (latex or rubber) that is worn over the penis during sexual intercourse. It can be used with spermicide to increase effectiveness.  Female condom. This is a soft,  loose-fitting sheath that is put into the vagina before sexual intercourse.  Diaphragm. This is a soft, latex, dome-shaped barrier that must be fitted by a health care provider. It is inserted into the vagina, along with a spermicidal jelly. It is inserted before intercourse. The diaphragm should be left in the vagina for 6 to 8 hours after intercourse.  Cervical cap.  This is a round, soft, latex or plastic cup that fits over the cervix and must be fitted by a health care provider. The cap can be left in place for up to 48 hours after intercourse.  Sponge. This is a soft, circular piece of polyurethane foam. The sponge has spermicide in it. It is inserted into the vagina after wetting it and before sexual intercourse.  Spermicides. These are chemicals that kill or block sperm from entering the cervix and uterus. They come in the form of creams, jellies, suppositories, foam, or tablets. They do not require a prescription. They are inserted into the vagina with an applicator before having sexual intercourse. The process must be repeated every time you have sexual intercourse. INTRAUTERINE CONTRACEPTION  Intrauterine device (IUD). This is a T-shaped device that is put in a woman's uterus during a menstrual period to prevent pregnancy. There are 2 types:  Copper IUD. This type of IUD is wrapped in copper wire and is placed inside the uterus. Copper makes the uterus and fallopian tubes produce a fluid that kills sperm. It can stay in place for 10 years.  Hormone IUD. This type of IUD contains the hormone progestin (synthetic progesterone). The hormone thickens the cervical mucus and prevents sperm from entering the uterus, and it also thins the uterine lining to prevent implantation of a fertilized egg. The hormone can weaken or kill the sperm that get into the uterus. It can stay in place for 3-5 years, depending on which type of IUD is used. PERMANENT METHODS OF CONTRACEPTION  Female tubal ligation. This is when the woman's fallopian tubes are surgically sealed, tied, or blocked to prevent the egg from traveling to the uterus.  Hysteroscopic sterilization. This involves placing a small coil or insert into each fallopian tube. Your doctor uses a technique called hysteroscopy to do the procedure. The device causes scar tissue to form. This results in permanent blockage  of the fallopian tubes, so the sperm cannot fertilize the egg. It takes about 3 months after the procedure for the tubes to become blocked. You must use another form of birth control for these 3 months.  Female sterilization. This is when the female has the tubes that carry sperm tied off (vasectomy).This blocks sperm from entering the vagina during sexual intercourse. After the procedure, the man can still ejaculate fluid (semen). NATURAL PLANNING METHODS  Natural family planning. This is not having sexual intercourse or using a barrier method (condom, diaphragm, cervical cap) on days the woman could become pregnant.  Calendar method. This is keeping track of the length of each menstrual cycle and identifying when you are fertile.  Ovulation method. This is avoiding sexual intercourse during ovulation.  Symptothermal method. This is avoiding sexual intercourse during ovulation, using a thermometer and ovulation symptoms.  Post-ovulation method. This is timing sexual intercourse after you have ovulated. Regardless of which type or method of contraception you choose, it is important that you use condoms to protect against the transmission of sexually transmitted infections (STIs). Talk with your health care provider about which form of contraception is most appropriate for you. Document Released:  11/13/2005 Document Revised: 11/18/2013 Document Reviewed: 05/08/2013 [facility] Patient Information 2015 [place], [place]. This information is not intended to replace advice given to you by your health care provider. Make sure you discuss any questions you have with your health care provider.

## 2015-02-09 NOTE — Progress Notes (Addendum)
Subjective:   This chart was scribed for Kim Staggers, MD by Jarvis Morgan, Medical Scribe. This patient was seen in Room 1 and the patient's care was started at 4:58 PM.   Patient ID: Kim Banks, female    DOB: 1991/04/05, 24 y.o.   MRN: [id number]  Chief Complaint  Patient presents with  . Exposure to STD    need screening for STD, HCG  . Rash    on pelvic area x 1 week    HPI Kim Banks is a 24 y.o. female who presents for an STD screening. Pt states she has a rash to her pelvic area for 1 week that she wants to be checked. Pt had unprotected sex around 10 days ago with a new partner. Pt contracted chlamydia 4 years ago and took full round of antibiotics. She states her last STD screening was in November 2015. She is not on BC at the moment. Her LNMP was 01/18/15. She denies any concern for pregnancy, she took at home pregnancy chance at it was negative. Pt does not want to be on BC at the moment due to previous negative experiences with her [facility] regimen. She denies any abnormal vaginal discharge, fever, chills, abdominal pain, pelvic pain, or dysuria.   Sexually active with males only.   PCP: No PCP Per Patient  There are no active problems to display for this patient.  Past Medical History  Diagnosis Date  . Asthma    History reviewed. No pertinent past surgical history. No Known Allergies Prior to Admission medications   Medication Sig Start Date End Date Taking? Authorizing Provider  albuterol (PROVENTIL HFA;VENTOLIN HFA) 108 (90 BASE) MCG/ACT inhaler Inhale 2 puffs into the lungs every 6 (six) hours as needed for wheezing.   Yes Historical Provider, MD  albuterol (PROVENTIL) (2.5 MG/3ML) 0.083% nebulizer solution Take 2.5 mg by nebulization every 6 (six) hours as needed for wheezing or shortness of breath.   Yes Historical Provider, MD   History   Social History  . Marital Status: Single    Spouse Name: N/A  . Number of Children: N/A  . Years of Education:  N/A   Occupational History  . Not on file.   Social History Main Topics  . Smoking status: Never Smoker   . Smokeless tobacco: Not on file  . Alcohol Use: Yes     Comment: occ  . Drug Use: No  . Sexual Activity: Yes    Birth Control/ Protection: None   Other Topics Concern  . Not on file   Social History Narrative    Review of Systems  Constitutional: Negative for fever and chills.  Gastrointestinal: Negative for abdominal pain.  Genitourinary: Negative for dysuria, vaginal discharge and vaginal pain.  Skin: Positive for rash (pelvic area).  All other systems reviewed and are negative.      Objective:   Physical Exam  Constitutional: She is oriented to person, place, and time. She appears well-developed and well-nourished. No distress.  HENT:  Head: Normocephalic and atraumatic.  Eyes: Conjunctivae and EOM are normal.  Neck: Neck supple. No tracheal deviation present.  Cardiovascular: Normal rate, regular rhythm and normal heart sounds.   Pulmonary/Chest: Effort normal and breath sounds normal. No respiratory distress.  Abdominal: Soft. There is no tenderness. There is no CVA tenderness.  Genitourinary:  Approx 7-8 small dry papules in area of shaved genital hair. No vesicle, no surrounding erythema, no other external lesions or rash. Minimal clear white discharge in vault,  there are no tears or other vaginal lesions. Cervix appeared WNL, no CMT or adnexal tenderness  Musculoskeletal: Normal range of motion.  Neurological: She is alert and oriented to person, place, and time.  Skin: Skin is warm and dry.  Psychiatric: She has a normal mood and affect. Her behavior is normal.  Nursing note and vitals reviewed.    Filed Vitals:   02/09/15 1646  BP: 116/66  Pulse: 91  Temp: 97.9 F (36.6 C)  TempSrc: Oral  Resp: 16  Height: 5' 6.25" (1.683 m)  Weight: 142 lb 6.4 oz (64.592 kg)  SpO2: 96%    Results for orders placed or performed in visit on 02/09/15  POCT  Wet Prep with KOH  Result Value Ref Range   Trichomonas, UA Negative    Clue Cells Wet Prep HPF POC 1-3    Epithelial Wet Prep HPF POC 1-8    Yeast Wet Prep HPF POC NEG    Bacteria Wet Prep HPF POC 4+    RBC Wet Prep HPF POC NEG    WBC Wet Prep HPF POC 1-2    KOH Prep POC     7:10 PM Left prior to results above available.  We discussed while she was in office I would call her on cell phone after visit.  Called cell phone number provided - no answer and unable to leave message. Will try again later.      Assessment & Plan:  [name] is a 24 y.o. female Vaginal discharge - Plan: HIV antibody, RPR, GC/chlamydia probe amp, genital, POCT Wet Prep with KOH  -check STI testing. Trichomonas testing negative.  Attempted to call after visit as she left prior to Wet prep results, but unable to reach on phone as above. rtc if worsening and STI testing pending.   Unprotected sexual intercourse - Plan: HIV antibody, RPR, GC/chlamydia probe amp, genital, POCT Wet Prep with KOH  -STI testing obtained, safer sex practices discussed. Also discussed contraceptive choices. She does not plan on pregnancy at this time, but is not interested in prescription contraception. Condoms discussed everytime, but limitations discussed. Handout provided on options if she changes her mind on contraception, and advised to let me know if any questions on available options.    Rash of genital area - Plan: HSV(herpes simplex vrs) 1+2 ab-IgG, Herpes simplex virus culture  -suspected folliculitis with shaving. No vesicles noted, but HSV titer and swab obtained.  -advised to not shave close to area for now, and in future brush loose hairs off after shaving. rtc precautions.   No orders of the defined types were placed in this encounter.   Patient Instructions  You should receive a call or letter about your lab results within the next week to 10 days. We will call you about the trichomonas and wet prep test tonight.    The bumps in the genital area appear to be "shave bumps".  Avoid shaving area for now,. Then as improves - try not to shave as close, and brush off loose hairs after shaving. If new areas or any worsening - return for recheck.   If you do not have your period at typical time, then you can either check a home urine pregnancy test (within a week to 10 days from missed period), or return here for testing.  Condoms every time and see information below on contraceptive options. Let me know if you want to discuss these further or if you have any questions.  Return to  the clinic or go to the nearest emergency room if any of your symptoms worsen or new symptoms occur.   Contraception Choices Contraception (birth control) is the use of any methods or devices to prevent pregnancy. Below are some methods to help avoid pregnancy. HORMONAL METHODS   Contraceptive implant. This is a thin, plastic tube containing progesterone hormone. It does not contain estrogen hormone. Your health care provider inserts the tube in the inner part of the upper arm. The tube can remain in place for up to 3 years. After 3 years, the implant must be removed. The implant prevents the ovaries from releasing an egg (ovulation), thickens the cervical mucus to prevent sperm from entering the uterus, and thins the lining of the inside of the uterus.  Progesterone-only injections. These injections are given every 3 months by your health care provider to prevent pregnancy. This synthetic progesterone hormone stops the ovaries from releasing eggs. It also thickens cervical mucus and changes the uterine lining. This makes it harder for sperm to survive in the uterus.  Birth control pills. These pills contain estrogen and progesterone hormone. They work by preventing the ovaries from releasing eggs (ovulation). They also cause the cervical mucus to thicken, preventing the sperm from entering the uterus. Birth control pills are prescribed by a  health care provider.Birth control pills can also be used to treat heavy periods.  Minipill. This type of birth control pill contains only the progesterone hormone. They are taken every day of each month and must be prescribed by your health care provider.  Birth control patch. The patch contains hormones similar to those in birth control pills. It must be changed once a week and is prescribed by a health care provider.  Vaginal ring. The ring contains hormones similar to those in birth control pills. It is left in the vagina for 3 weeks, removed for 1 week, and then a new one is put back in place. The patient must be comfortable inserting and removing the ring from the vagina.A health care provider's prescription is necessary.  Emergency contraception. Emergency contraceptives prevent pregnancy after unprotected sexual intercourse. This pill can be taken right after sex or up to 5 days after unprotected sex. It is most effective the sooner you take the pills after having sexual intercourse. Most emergency contraceptive pills are available without a prescription. Check with your pharmacist. Do not use emergency contraception as your only form of birth control. BARRIER METHODS   Female condom. This is a thin sheath (latex or rubber) that is worn over the penis during sexual intercourse. It can be used with spermicide to increase effectiveness.  Female condom. This is a soft, loose-fitting sheath that is put into the vagina before sexual intercourse.  Diaphragm. This is a soft, latex, dome-shaped barrier that must be fitted by a health care provider. It is inserted into the vagina, along with a spermicidal jelly. It is inserted before intercourse. The diaphragm should be left in the vagina for 6 to 8 hours after intercourse.  Cervical cap. This is a round, soft, latex or plastic cup that fits over the cervix and must be fitted by a health care provider. The cap can be left in place for up to 48 hours  after intercourse.  Sponge. This is a soft, circular piece of polyurethane foam. The sponge has spermicide in it. It is inserted into the vagina after wetting it and before sexual intercourse.  Spermicides. These are chemicals that kill or block sperm from  entering the cervix and uterus. They come in the form of creams, jellies, suppositories, foam, or tablets. They do not require a prescription. They are inserted into the vagina with an applicator before having sexual intercourse. The process must be repeated every time you have sexual intercourse. INTRAUTERINE CONTRACEPTION  Intrauterine device (IUD). This is a T-shaped device that is put in a woman's uterus during a menstrual period to prevent pregnancy. There are 2 types:  Copper IUD. This type of IUD is wrapped in copper wire and is placed inside the uterus. Copper makes the uterus and fallopian tubes produce a fluid that kills sperm. It can stay in place for 10 years.  Hormone IUD. This type of IUD contains the hormone progestin (synthetic progesterone). The hormone thickens the cervical mucus and prevents sperm from entering the uterus, and it also thins the uterine lining to prevent implantation of a fertilized egg. The hormone can weaken or kill the sperm that get into the uterus. It can stay in place for 3-5 years, depending on which type of IUD is used. PERMANENT METHODS OF CONTRACEPTION  Female tubal ligation. This is when the woman's fallopian tubes are surgically sealed, tied, or blocked to prevent the egg from traveling to the uterus.  Hysteroscopic sterilization. This involves placing a small coil or insert into each fallopian tube. Your doctor uses a technique called hysteroscopy to do the procedure. The device causes scar tissue to form. This results in permanent blockage of the fallopian tubes, so the sperm cannot fertilize the egg. It takes about 3 months after the procedure for the tubes to become blocked. You must use another  form of birth control for these 3 months.  Female sterilization. This is when the female has the tubes that carry sperm tied off (vasectomy).This blocks sperm from entering the vagina during sexual intercourse. After the procedure, the man can still ejaculate fluid (semen). NATURAL PLANNING METHODS  Natural family planning. This is not having sexual intercourse or using a barrier method (condom, diaphragm, cervical cap) on days the woman could become pregnant.  Calendar method. This is keeping track of the length of each menstrual cycle and identifying when you are fertile.  Ovulation method. This is avoiding sexual intercourse during ovulation.  Symptothermal method. This is avoiding sexual intercourse during ovulation, using a thermometer and ovulation symptoms.  Post-ovulation method. This is timing sexual intercourse after you have ovulated. Regardless of which type or method of contraception you choose, it is important that you use condoms to protect against the transmission of sexually transmitted infections (STIs). Talk with your health care provider about which form of contraception is most appropriate for you. Document Released: 11/13/2005 Document Revised: 11/18/2013 Document Reviewed: 05/08/2013 [facility] Patient Information 2015 [employer] [place]. This information is not intended to replace advice given to you by your health care provider. Make sure you discuss any questions you have with your health care provider.        I personally performed the services described in this documentation, which was scribed in my presence. The recorded information has been reviewed and considered, and addended by me as needed.

## 2015-02-10 LAB — RPR

## 2015-02-10 LAB — HIV ANTIBODY (ROUTINE TESTING W REFLEX): HIV 1&2 Ab, 4th Generation: NONREACTIVE

## 2015-02-11 LAB — HSV(HERPES SIMPLEX VRS) I + II AB-IGG: HSV 1 Glycoprotein G Ab, IgG: <0.1 IV

## 2015-02-11 LAB — GC/CHLAMYDIA PROBE AMP
CT PROBE, AMP APTIMA: NEGATIVE
GC PROBE AMP APTIMA: NEGATIVE

## 2015-02-12 LAB — HERPES SIMPLEX VIRUS CULTURE: Organism ID, Bacteria: NOT DETECTED

## 2015-02-22 ENCOUNTER — Ambulatory Visit: Admitting: Family Medicine

## 2015-03-27 ENCOUNTER — Emergency Department (HOSPITAL_COMMUNITY)
Admission: EM | Admit: 2015-03-27 | Discharge: 2015-03-27 | Disposition: A | Discharge status: Home / Self Care | Attending: Emergency Medicine | Admitting: Emergency Medicine

## 2015-03-27 ENCOUNTER — Encounter (HOSPITAL_COMMUNITY): Payer: Self-pay | Admitting: *Deleted

## 2015-03-27 DIAGNOSIS — N939 Abnormal uterine and vaginal bleeding, unspecified: Secondary | ICD-10-CM | POA: Diagnosis present

## 2015-03-27 DIAGNOSIS — R531 Weakness: Secondary | ICD-10-CM | POA: Insufficient documentation

## 2015-03-27 DIAGNOSIS — J45909 Unspecified asthma, uncomplicated: Secondary | ICD-10-CM | POA: Insufficient documentation

## 2015-03-27 DIAGNOSIS — R5383 Other fatigue: Secondary | ICD-10-CM | POA: Insufficient documentation

## 2015-03-27 DIAGNOSIS — Z79899 Other long term (current) drug therapy: Secondary | ICD-10-CM | POA: Insufficient documentation

## 2015-03-27 DIAGNOSIS — Z3202 Encounter for pregnancy test, result negative: Secondary | ICD-10-CM | POA: Diagnosis not present

## 2015-03-27 DIAGNOSIS — N921 Excessive and frequent menstruation with irregular cycle: Secondary | ICD-10-CM

## 2015-03-27 LAB — URINE MICROSCOPIC-ADD ON

## 2015-03-27 LAB — URINALYSIS, ROUTINE W REFLEX MICROSCOPIC
BILIRUBIN URINE: NEGATIVE
GLUCOSE, UA: NEGATIVE mg/dL
Ketones, ur: 15 mg/dL — AB
Leukocytes, UA: NEGATIVE
NITRITE: NEGATIVE
Protein, ur: NEGATIVE mg/dL
SPECIFIC GRAVITY, URINE: 1.031 — AB (ref 1.005–1.030)
Urobilinogen, UA: 1 mg/dL (ref 0.0–1.0)
pH: 5.5 (ref 5.0–8.0)

## 2015-03-27 LAB — WET PREP, GENITAL
Trich, Wet Prep: NONE SEEN
YEAST WET PREP: NONE SEEN

## 2015-03-27 LAB — POC URINE PREG, ED: Preg Test, Ur: NEGATIVE

## 2015-03-27 NOTE — ED Notes (Signed)
Pt states that she has had irregular vaginal bleeding for the last month. Pt states that she has had discharge in between bleeding. Pt states that she tried to follow up with her pcp for a pap smear but has not been able to.

## 2015-03-27 NOTE — ED Provider Notes (Signed)
CSN: [id number]     Arrival date & time 03/27/15  1320 History   First MD Initiated Contact with Patient 03/27/15 1657     Chief Complaint  Patient presents with  . Vaginal Bleeding     (Consider location/radiation/quality/duration/timing/severity/associated sxs/prior Treatment) HPI Kim Banks is a 24 y.o. female with history of asthma, presents to emergency department complaining of irregular vaginal bleeding. Patient states that she has had irregular periods in the past, but states in the last month. She has been spotting in between her regular period. She states that she went to urgent care, where they did a pelvic exam and told her that her cervix was red and inflamed but was told that everything looked normal. The patient states that she had abnormal Pap smear in 2013 and had a LEEP procedure done which did not show any abnormalities. The patient states after being told that her cervix was red and having this irregular bleeding. She was concerned that she may have cervical cancer. Patient states that she has been feeling fatigued, weak, having associated headaches. She denies being pregnant. She denies any urinary symptoms. She states the last 2 days. Bleeding has been heavier than her regular period and that is what made her come in. She states that she [place] but she wasn't able to get in touch with the last 2 weeks.  Past Medical History  Diagnosis Date  . Asthma    History reviewed. No pertinent past surgical history. No family history on file. History  Substance Use Topics  . Smoking status: Never Smoker   . Smokeless tobacco: Not on file  . Alcohol Use: Yes     Comment: occ   OB History    Gravida Para Term Preterm AB TAB SAB Ectopic Multiple Living   2 1 1       1      Review of Systems  Constitutional: Positive for fatigue. Negative for fever and chills.  Respiratory: Negative for cough, chest tightness and shortness of  breath.   Cardiovascular: Negative for chest pain, palpitations and leg swelling.  Gastrointestinal: Negative for nausea, vomiting, abdominal pain and diarrhea.  Genitourinary: Positive for vaginal bleeding and pelvic pain. Negative for dysuria, flank pain, vaginal discharge and vaginal pain.  Musculoskeletal: Negative for myalgias, arthralgias, neck pain and neck stiffness.  Skin: Negative for rash.  Neurological: Positive for weakness. Negative for dizziness and headaches.  All other systems reviewed and are negative.     Allergies  Review of patient's allergies indicates no known allergies.  Home Medications   Prior to Admission medications   Medication Sig Start Date End Date Taking? Authorizing Provider  albuterol (PROVENTIL HFA;VENTOLIN HFA) 108 (90 BASE) MCG/ACT inhaler Inhale 2 puffs into the lungs every 6 (six) hours as needed for wheezing or shortness of breath.    Yes Historical Provider, MD  albuterol (PROVENTIL) (2.5 MG/3ML) 0.083% nebulizer solution Take 2.5 mg by nebulization every 6 (six) hours as needed for wheezing or shortness of breath.   Yes Historical Provider, MD  methylphenidate 54 MG PO CR tablet Take 54 mg by mouth 2 (two) times a week. Take 1 tablet (54 mg) 2 hours before classes start- Monday and Wednesday   Yes Historical Provider, MD   BP 112/72 mmHg  Pulse 86  Temp(Src) 98.2 F (36.8 C) (Oral)  Resp 16  SpO2 100% Physical Exam  Constitutional: She appears well-developed and well-nourished. No distress.  HENT:  Head: Normocephalic.  Eyes: Conjunctivae are normal.  Neck: Neck supple.  Cardiovascular: Normal rate, regular rhythm and normal heart sounds.   Pulmonary/Chest: Effort normal and breath sounds normal. No respiratory distress. She has no wheezes. She has no rales.  Abdominal: Soft. Bowel sounds are normal. She exhibits no distension. There is no tenderness. There is no rebound.  Genitourinary:  Normal external genitalia. Normal vaginal  canal. Small blood in vaginal canal. Cervix is normal, closed. No CMT. No uterine or adnexal tenderness. No masses palpated.    Musculoskeletal: She exhibits no edema.  Neurological: She is alert.  Skin: Skin is warm and dry.  Psychiatric: She has a normal mood and affect. Her behavior is normal.  Nursing note and vitals reviewed.   ED Course  Procedures (including critical care time) Labs Review Labs Reviewed  WET PREP, GENITAL - Abnormal; Notable for the following:    Clue Cells Wet Prep HPF POC FEW (*)    WBC, Wet Prep HPF POC FEW (*)    All other components within normal limits  URINALYSIS, ROUTINE W REFLEX MICROSCOPIC - Abnormal; Notable for the following:    Color, Urine AMBER (*)    APPearance CLOUDY (*)    Specific Gravity, Urine 1.031 (*)    Hgb urine dipstick LARGE (*)    Ketones, ur 15 (*)    All other components within normal limits  URINE MICROSCOPIC-ADD ON  POC URINE PREG, ED  GC/CHLAMYDIA PROBE AMP ([place])    Imaging Review No results found.   EKG Interpretation None      MDM   Final diagnoses:  Menometrorrhagia    Patient with irregular menstrual cycles, history of abnormal Pap. She is concerned she may have cervical cancer. Her vital signs are normal. She is nontoxic appearing. We will check urinalysis and urine pregnancy, with a pelvic exam.   Showed no significant pelvic pain, however, having some bleeding. History of irregular periods but states she started her period 2 days ago, and had constant spotting in between her last period and this time. This is unusual for her. She is concerned about cervical cancer because she has been googling her symptoms. Asking for Pap smear. At this point, I really do not do this. I will refer her to primary care doctor or women's clinic. Patient agreed. I do not think she needs any imaging at this time. Her exam was normal. Her vital signs are normal. She is given return precautions.  Filed Vitals:   03/27/15  1930 03/27/15 1945 03/27/15 2000 03/27/15 2015  BP: 109/63 120/76 116/68 124/73  Pulse: 70 82 78 81  Temp:      TempSrc:      Resp:      SpO2: 98% 100% 99% 100%     Jaynie Crumble, PA-C 03/27/15 2025  Doug Sou, MD 03/28/15 0151

## 2015-03-27 NOTE — Discharge Instructions (Signed)
Please follow up with resources provided for a pap smear. Also follow up with GYN doctor regarding your irregular periods.    Abnormal Uterine Bleeding Abnormal uterine bleeding can affect women at various stages in life, including teenagers, women in their reproductive years, pregnant women, and women who have reached menopause. Several kinds of uterine bleeding are considered abnormal, including:  Bleeding or spotting between periods.   Bleeding after sexual intercourse.   Bleeding that is heavier or more than normal.   Periods that last longer than usual.  Bleeding after menopause.  Many cases of abnormal uterine bleeding are minor and simple to treat, while others are more serious. Any type of abnormal bleeding should be evaluated by your health care provider. Treatment will depend on the cause of the bleeding. HOME CARE INSTRUCTIONS Monitor your condition for any changes. The following actions may help to alleviate any discomfort you are experiencing:  Avoid the use of tampons and douches as directed by your health care provider.  Change your pads frequently. You should get regular pelvic exams and Pap tests. Keep all follow-up appointments for diagnostic tests as directed by your health care provider.  SEEK MEDICAL CARE IF:   Your bleeding lasts more than 1 week.   You feel dizzy at times.  SEEK IMMEDIATE MEDICAL CARE IF:   You pass out.   You are changing pads every 15 to 30 minutes.   You have abdominal pain.  You have a fever.   You become sweaty or weak.   You are passing large blood clots from the vagina.   You start to feel nauseous and vomit. MAKE SURE YOU:   Understand these instructions.  Will watch your condition.  Will get help right away if you are not doing well or get worse. Document Released: 11/13/2005 Document Revised: 11/18/2013 Document Reviewed: 06/12/2013 [facility] Patient Information 2015 [place], [place]. This information is not  intended to replace advice given to you by your health care provider. Make sure you discuss any questions you have with your health care provider.   Emergency Department Resource Guide 1) Find a Doctor and Pay Out of Pocket Although you won't have to find out who is covered by your insurance plan, it is a good idea to ask around and get recommendations. You will then need to call the office and see if the doctor you have chosen will accept you as a new patient and what types of options they offer for patients who are self-pay. Some doctors offer discounts or will set up payment plans for their patients who do not have insurance, but you will need to ask so you aren't surprised when you get to your appointment.  2) Contact Your Local Health Department Not all health departments have doctors that can see patients for sick visits, but many do, so it is worth a call to see if yours does. If you don't know where your local health department is, you can check in your phone book. The CDC also has a tool to help you locate your state's health department, and many state websites also have listings of all of their local health departments.  3) Find a Walk-in Clinic If your illness is not likely to be very severe or complicated, you may want to try a walk in clinic. These are popping up all over the country in pharmacies, drugstores, and shopping centers. They're usually staffed by nurse practitioners or physician assistants that have been trained to treat common illnesses and  complaints. They're usually fairly quick and inexpensive. However, if you have serious medical issues or chronic medical problems, these are probably not your best option.  No Primary Care Doctor: - Call Health Connect at  [phone] - they can help you locate a primary care doctor that  accepts your insurance, provides certain services, etc. - Physician Referral Service- [phone]  Chronic Pain Problems: Organization          Address  Phone   Notes  [facility]  [phone]) [phone] Patients need to be referred by their primary care doctor.   Medication Assistance: Organization         Address  Phone   Notes  [facility] Medication [facility] [address] [place], [place] [address] [phone] --Must be a resident of [facility] [place] -- Must have NO insurance coverage whatsoever (no Medicaid/ Medicare, etc.) -- The pt. MUST have a primary care doctor that directs their care regularly and follows them in the community   Med[facility]ist  [phone]) [phone]   [employer]  [phone]    Agencies that provide inexpensive medical care: Organization         Address  Phone   Notes  [facility]  [phone]) [phone]   [facility]    [phone]   [facility] [address] [place], [place] [address] [phone]) [phone]   [facility] [address], [place] [phone]   [facility]anned Parenthood    [phone]   [facility]    [phone]   [facility]  [address], [place] Phone:  [phone], Fax:  [phone] Hours of Operation:  9 am - 6 pm, M-F.  Also accepts Medicaid/Medicare and self-pay.  [facility]  [address], [place] Phone: [phone], Fax: [phone]. Hours of Operation:  8:30 am - 5:30 pm, M-F.  Also accepts Medicaid and self-pay.  [facility] [place] [address], [place] Phone: [phone]) [phone]   [facility] [address], [place], [place] [phone], Ext. [phone] Mondays & Thursdays: 7-9 AM.  First 15 patients are seen on a first come, first serve basis.    Medicaid-accepting [facility] Providers:  Organization         Address  Phone   Notes  [facility] [address], [place] [phone]) [phone] Also accepts self-pay patients.  [facility] [address], [place]  [phone]) [phone]   [facility] [address], [place] [phone]) [phone]   [facility] [address] [place] [address], [place] [phone]) [phone]   Renaye RakersVeita Bland [address], [place]   [phone] Only accepts [place] Access [place] patients after they have their name applied to their card.   Self-Pay (no insurance) in [facility]:  Organization         Address  Phone   Notes  Sickle Cell Patients, [facility] [address], [place] [phone]) [phone]   [facility] [address], [place] [phone]) [phone]   [facility] [place]  [address], [place] [phone]   [facility]/Dr. Osei-Bonsu  [address] [place] [address], [place] or [phone], [place] (  [phone] Phone number for both [place] and [place] locations is the same.  [facility] [address] [place] [phone]   [facility] [address] [place] or [address] [phone]   [facility] [address], [place] [phone], phone; [phone], fax Sees patients 1st and 3rd Saturday of every month.  Must not qualify for public or private insurance (i.e. Medicaid, Medicare, [place] Health Choice, Veterans' Benefits)  Household income should be no more than 200% of the poverty level The clinic cannot treat you if you are pregnant or think you are pregnant  Sexually transmitted diseases are not treated at the clinic.    Dental Care: Organization         Address  Phone  Notes  [facility] [address] [place], [place] [phone] Accepts children up to age 46 who are enrolled in [place] or [place] Health Choice; pregnant women with a Medicaid card; and  children who have applied for Medicaid or [place] Health Choice, but were declined, whose parents can pay a reduced fee at time of service.  [facility]  [address], [place] [phone] Accepts children up to age 72 who are enrolled in [place] or [place] Health Choice; pregnant women with a Medicaid card; and children who have applied for Medicaid or [place] Health Choice, but were declined, whose parents can pay a reduced fee at time of service.  [facility]  [address] [place], [place] [phone] Patients are seen by appointment only. Walk-ins are not accepted. [facility] will see patients 79 years of age and older. Monday - Tuesday (8am-5pm) Most Wednesdays (8:30-5pm) $30 per visit, cash only  [facility]  [address], [facility] [phone] Patients are seen by appointment only. Walk-ins are not accepted. [facility] will see patients 63 years of age and older. One Wednesday Evening (Monthly: Volunteer Based).  $30 per visit, cash only  [employer]  [phone] for adults; Children under age 77, call Graduate Pediatric Dentistry at [phone]. Children aged 75-14, please call [phone] to request a pediatric application.  Dental services are provided in all areas of dental care including fillings, crowns and bridges, complete and partial dentures, implants, gum treatment, root canals, and extractions. Preventive care is also provided. Treatment is provided to both adults and children. Patients are selected via a lottery and there is often a waiting list.   [facility] [address], [place]  [phone] [email or web address]   [facility] [address] [place], [place] [phone], Ext. [phone] Second and Fourth Thursday of each month, opens at 6:30 AM; Clinic ends at 9 AM.  Patients are seen on a first-come first-served  basis, and a limited number are seen during each clinic.   [facility]  [address] [place], [place] [phone]   Eligibility Requirements You must have lived in [place], [place], or [place] counties for at least the last three months.   You cannot be eligible for state or federal sponsored [place], including [employer], [place], or [employer].   You generally cannot be eligible for healthcare insurance through your employer.    How to apply: Eligibility screenings are held every Tuesday and Wednesday afternoon  from 1:00 pm until 4:00 pm. You do not need an appointment for the interview!  [facility] [address], [place], [place] [phone]   [facility]  [phone]   [facility] Health Department  [phone]   [facility] Department  [phone]    Behavioral Health Resources in the Community: Intensive Outpatient Programs Organization         Address  Phone  Notes  [facility] [address], [place], [place] [phone]   [facility] Outpatient [address] [place], [place] [phone]   [facility] [address], [place], [place]  [phone]   [facility] [address]  [place], [place] [phone] or [phone]   Substance Abuse Resources Organization         Address  Phone  Notes  [facility] Drug [facility]  [phone]   [facility]  [phone]   [facility] [place]  [phone]   Floydene Flock  [phone]   Residential & Outpatient Substance Abuse Program  9861423024   Psychological Services Organization         Address  Phone  Notes  [facility] Behavioral Health  [phone]   [facility] Services  [phone]   [facility] [address] [place] [phone] or [phone]    Mobile Crisis Teams Organization          Address  Phone  Notes  [facility], Mobile Crisis Care Unit  [phone]   [facility]  [address] [place], [place] [phone]   Doristine Locks [address] [place] [phone]    Self-Help/Support Groups Organization         Address  Phone             Notes  [facility] [place] - variety of support groups  [phone]- I7437963 Call for more information  Narcotics Anonymous (NA), [facility] [address], [employer] [place]  2 meetings at this location   Statistician         Address  Phone  Notes  [facility] [address],    [place]  [phone]   [facility]  [address], [place] [address], [place], [place] [phone]   [facility] [address] [place], [place] [phone] Admissions: 8am-3pm M-F  [facility] [address]    [place], [place] [phone]   [facility] [address] [place], [place], [place] [phone]   [facility] [address],  [place], [place] [phone]   [facility] - Intensive Outpatient [address], [place], [place] [phone]   [facility] ([facility]iction [facility] [address]  [place], [place] [phone] or [phone]   [facility] (RTS) [address] [place], [place] [phone] Accepts Medicaid  [facility] [address],  [place] [phone] Substance Abuse/Addiction Treatment   [facility] Organization         Address  Phone  Notes  [facility]  [phone]   Angie Fava, PhD [address] [place], [place]   [phone] or [phone]   [facility] Behavioral   [address] [place], [place] [phone]   [facility] [address] [place], [place] [phone] Insurance/Medicaid/sponsorship  through [employer] and Families  [place], [place] [phone] [place], [place] [phone]    Dr. Adele Schilder  [phone]   [facility] [place] Dept. 1) [address], [place] 2) [place] 3)  [place] [address], [place] [phone]  [phone]   [place] [phone] or [phone] (After Hours)

## 2015-03-29 LAB — GC/CHLAMYDIA PROBE AMP (~~LOC~~) NOT AT ARMC
Chlamydia: NEGATIVE
Neisseria Gonorrhea: NEGATIVE
